# Patient Record
Sex: Male | Born: 1960 | Race: White | Hispanic: No | Marital: Married
Health system: Southern US, Community
[De-identification: ages and names within clinical notes are randomized; demographics above are authoritative.]

---

## 2011-10-27 ENCOUNTER — Ambulatory Visit: Payer: Self-pay | Admitting: Family Medicine

## 2011-11-04 ENCOUNTER — Ambulatory Visit: Payer: Self-pay | Admitting: Family Medicine

## 2013-09-20 IMAGING — CR DG ABDOMEN 1V
1 series · 2 of 2 positions shown · non-contrast
Comparison: none

REASON FOR EXAM: r/o retained kidney stone
COMMENTS:

[Series 1: ap · 0.17mm/px · 2 of 2 slices shown]
[im 1/2]
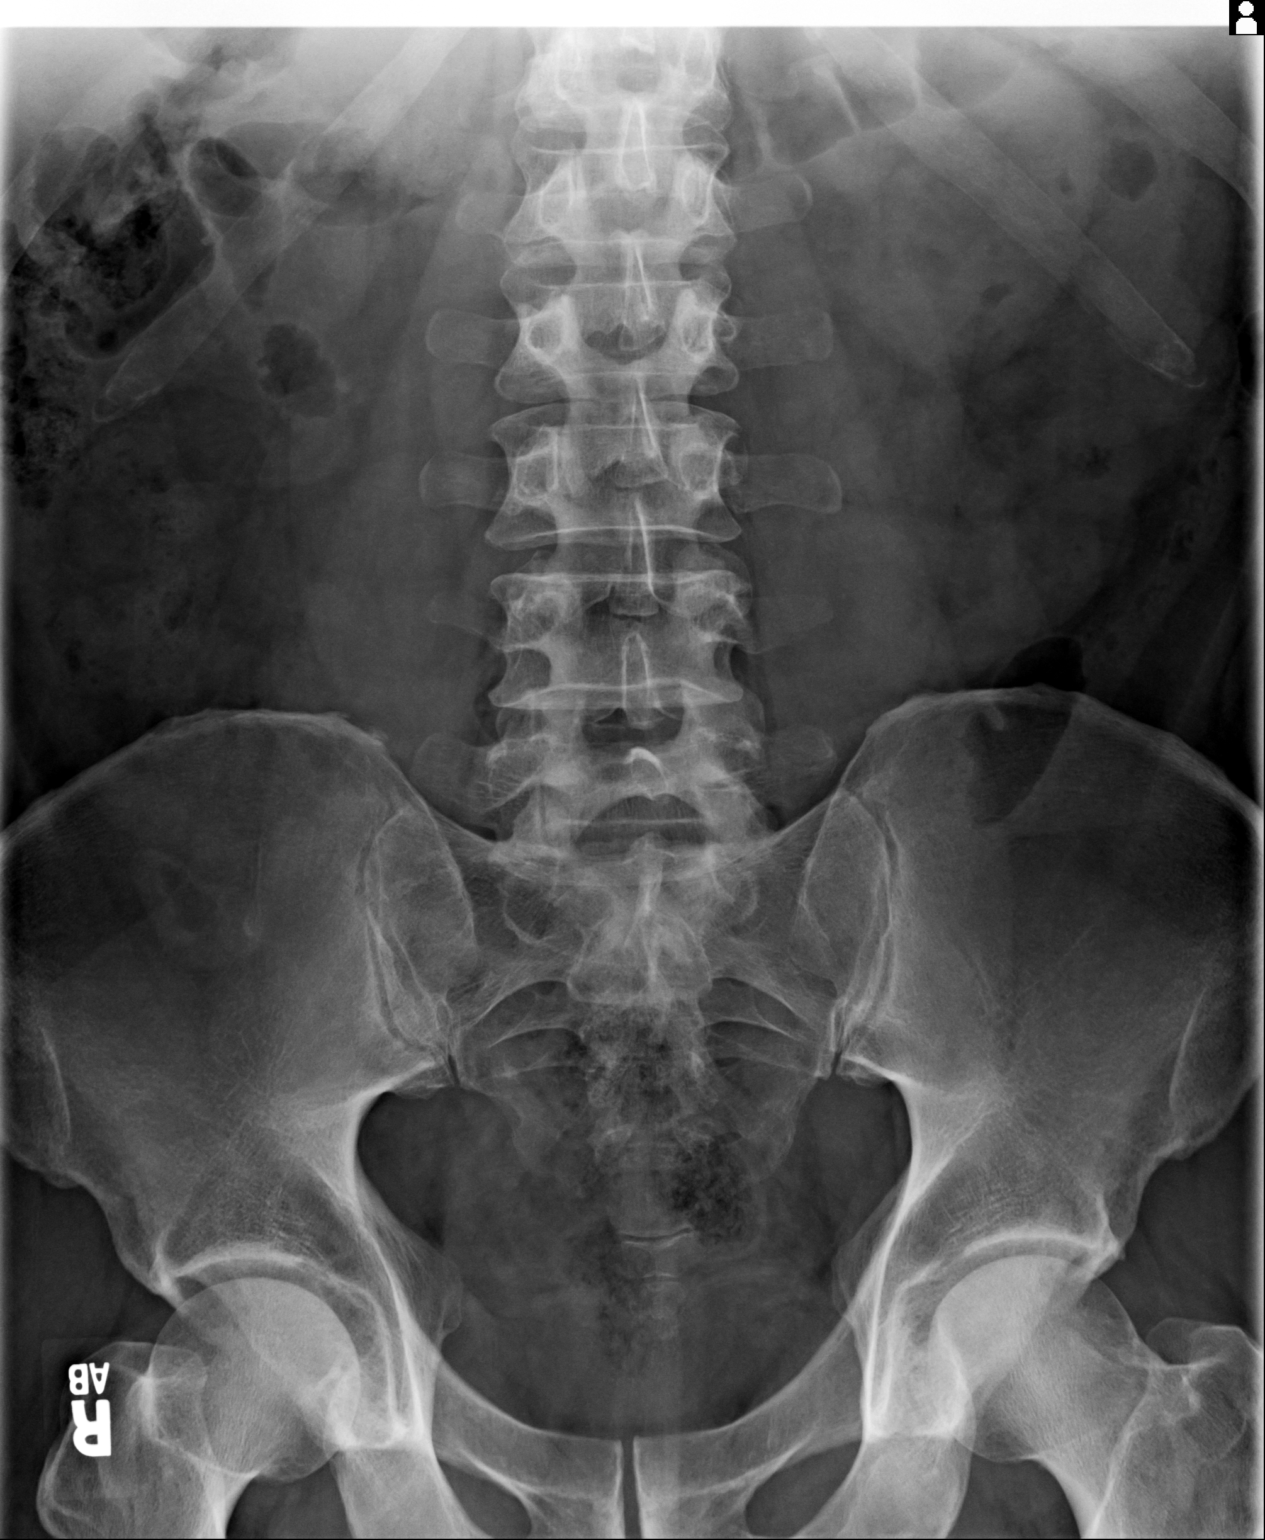
[im 2/2]
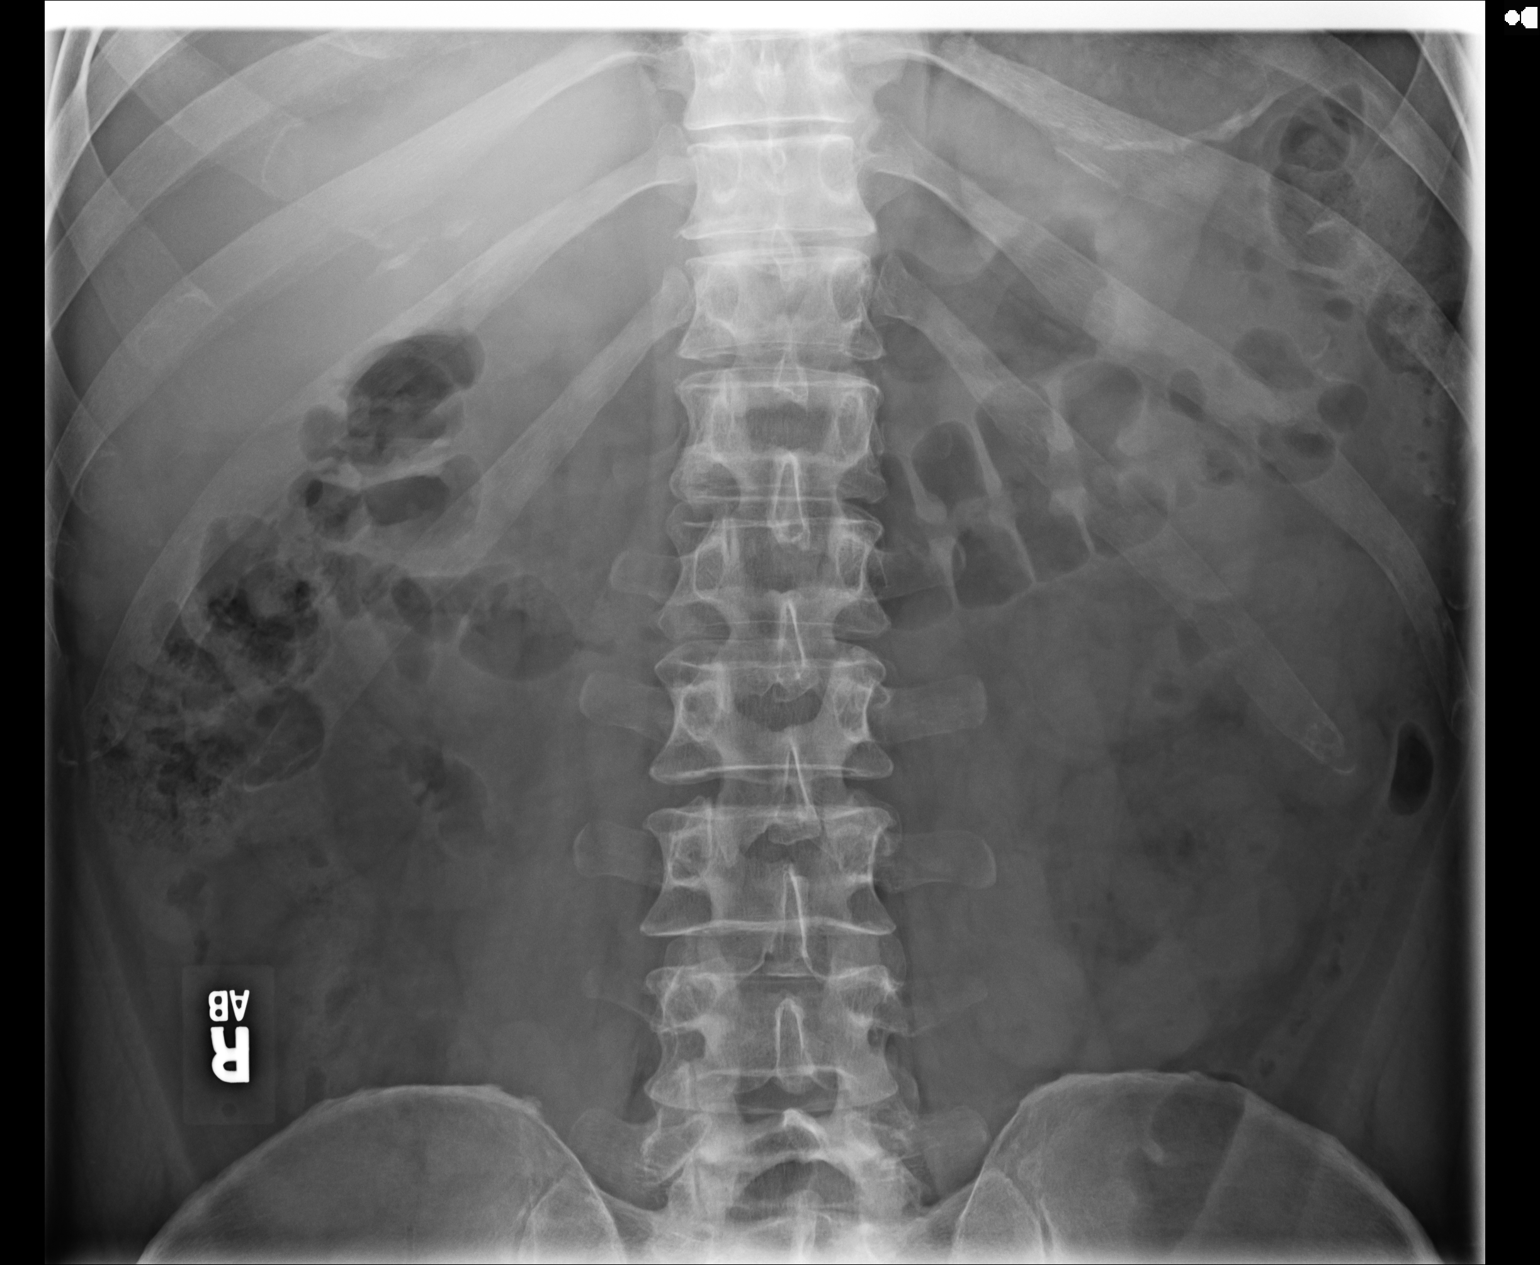

[2 of 2 positions shown; findings below may reference images not displayed]

PROCEDURE:     [facility] - MDR KIDNEY URETER BLADDER  - October 27, 2011 [DATE]

RESULT:     Portions of the kidneys are obscured by overlying bowel gas. One
image suggests some density over the area of the lower pole of the right
kidney which could represent a nonobstructing stone or artifact from the
overlying small bowel loop. The bowel gas pattern is unremarkable. The bony
structures are within normal limits.
IMPRESSION: 1. Possible artifact over the lower pole of the right kidney. A
nonobstructing lower pole right renal calculus is not excluded. Ultrasound
or CT is available for further assessment if necessary.

[REDACTED]

## 2019-07-26 ENCOUNTER — Ambulatory Visit: Payer: BC Managed Care – PPO | Attending: Internal Medicine

## 2019-07-26 DIAGNOSIS — Z20822 Contact with and (suspected) exposure to covid-19: Secondary | ICD-10-CM

## 2019-07-28 LAB — NOVEL CORONAVIRUS, NAA: SARS-CoV-2, NAA: NOT DETECTED

## 2019-08-01 ENCOUNTER — Ambulatory Visit: Payer: BC Managed Care – PPO | Attending: Internal Medicine

## 2019-08-01 DIAGNOSIS — Z20822 Contact with and (suspected) exposure to covid-19: Secondary | ICD-10-CM | POA: Insufficient documentation

## 2019-08-02 LAB — NOVEL CORONAVIRUS, NAA: SARS-CoV-2, NAA: NOT DETECTED

## 2019-09-01 ENCOUNTER — Ambulatory Visit: Payer: Managed Care, Other (non HMO) | Attending: Internal Medicine

## 2019-09-01 DIAGNOSIS — Z23 Encounter for immunization: Secondary | ICD-10-CM

## 2019-09-01 NOTE — Progress Notes (Signed)
   Covid-[facility] Vaccination [facility]  Name:  Darrell Mosley    MRN: [id number] DOB: [birth year]  09/01/2019  Darrell Mosley was observed post Covid-19 immunization for 15 minutes without incident. He was provided with Vaccine Information Sheet and instruction to access the V-Safe system.   Darrell Mosley was instructed to call 911 with any severe reactions post vaccine: Marland Kitchen Difficulty breathing  . Swelling of face and throat  . A fast heartbeat  . A bad rash all over body  . Dizziness and weakness   Immunizations Administered    Name Date Dose VIS Date Route   Pfizer COVID-19 Vaccine 09/01/2019 12:16 PM 0.3 mL 05/19/2019 Intramuscular   Manufacturer: [employer], [employer]   Lot: [id number]   NDC: [address]-4

## 2019-09-26 ENCOUNTER — Ambulatory Visit: Payer: Managed Care, Other (non HMO) | Attending: Internal Medicine

## 2019-09-26 DIAGNOSIS — Z23 Encounter for immunization: Secondary | ICD-10-CM

## 2019-09-26 NOTE — Progress Notes (Signed)
   Covid-19 Vaccination Clinic  Name:  Darrell Mosley    MRN: [id number] DOB: [birth year]  09/26/2019  [name] was observed post Covid-19 immunization for 15 minutes without incident. He was provided with Vaccine Information Sheet and instruction to access the V-Safe system.   [name] was instructed to call 911 with any severe reactions post vaccine: Marland Kitchen Difficulty breathing  . Swelling of face and throat  . A fast heartbeat  . A bad rash all over body  . Dizziness and weakness   Immunizations Administered    Name Date Dose VIS Date Route   Pfizer COVID-19 Vaccine 09/26/2019 12:16 PM 0.3 mL 08/02/2018 Intramuscular   Manufacturer: [employer], [employer]   Lot: [id number]   NDC: [address]-3
